# Patient Record
Sex: Male | Born: 2000 | Race: Black or African American | Hispanic: No | Marital: Single
Health system: Southern US, Community
[De-identification: ages and names within clinical notes are randomized; demographics above are authoritative.]

---
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
## 2020-12-01 ENCOUNTER — Emergency Department
Admission: EM | Admit: 2020-12-01 | Discharge: 2020-12-01 | Disposition: A | Discharge status: Home / Self Care | Payer: Medicaid Other | Attending: Emergency Medicine | Admitting: Emergency Medicine

## 2020-12-01 ENCOUNTER — Other Ambulatory Visit: Payer: Self-pay

## 2020-12-01 ENCOUNTER — Emergency Department: Payer: Medicaid Other

## 2020-12-01 DIAGNOSIS — R509 Fever, unspecified: Secondary | ICD-10-CM | POA: Diagnosis not present

## 2020-12-01 DIAGNOSIS — R5383 Other fatigue: Secondary | ICD-10-CM | POA: Diagnosis not present

## 2020-12-01 DIAGNOSIS — R109 Unspecified abdominal pain: Secondary | ICD-10-CM | POA: Diagnosis not present

## 2020-12-01 DIAGNOSIS — M545 Low back pain, unspecified: Secondary | ICD-10-CM | POA: Insufficient documentation

## 2020-12-01 DIAGNOSIS — R110 Nausea: Secondary | ICD-10-CM | POA: Diagnosis not present

## 2020-12-01 LAB — COMPREHENSIVE METABOLIC PANEL
ALT: 17 U/L (ref 0–44)
AST: 27 U/L (ref 15–41)
Albumin: 4.3 g/dL (ref 3.5–5.0)
Alkaline Phosphatase: 57 U/L (ref 38–126)
Anion gap: 8 (ref 5–15)
BUN: 14 mg/dL (ref 6–20)
CO2: 25 mmol/L (ref 22–32)
Calcium: 9.1 mg/dL (ref 8.9–10.3)
Chloride: 104 mmol/L (ref 98–111)
Creatinine, Ser: 1 mg/dL (ref 0.61–1.24)
GFR, Estimated: >60 mL/min (ref >60)
Glucose, Bld: 105 mg/dL — ABNORMAL HIGH (ref 70–99)
Potassium: 4 mmol/L (ref 3.5–5.1)
Sodium: 137 mmol/L (ref 135–145)
Total Bilirubin: 1 mg/dL (ref 0.3–1.2)
Total Protein: 7.8 g/dL (ref 6.5–8.1)

## 2020-12-01 LAB — CBC
HCT: 39.1 % (ref 39.0–52.0)
Hemoglobin: 13.7 g/dL (ref 13.0–17.0)
MCH: 29.5 pg (ref 26.0–34.0)
MCHC: 35 g/dL (ref 30.0–36.0)
MCV: 84.1 fL (ref 80.0–100.0)
Platelets: 159 10*3/uL (ref 150–400)
RBC: 4.65 MIL/uL (ref 4.22–5.81)
RDW: 13.8 % (ref 11.5–15.5)
WBC: 3.7 10*3/uL — ABNORMAL LOW (ref 4.0–10.5)
nRBC: 0 % (ref 0.0–0.2)

## 2020-12-01 LAB — LIPASE, BLOOD: Lipase: 30 U/L (ref 11–51)

## 2020-12-01 MED ORDER — SODIUM CHLORIDE 0.9 % IV SOLN
1000.0000 mL | Freq: Once | INTRAVENOUS | Status: AC
  Administered 2020-12-01: 1000 mL via INTRAVENOUS

## 2020-12-01 MED ORDER — ONDANSETRON HCL 4 MG/2ML IJ SOLN
4.0000 mg | Freq: Once | INTRAMUSCULAR | Status: AC
  Administered 2020-12-01: 4 mg via INTRAVENOUS
  Filled 2020-12-01: qty 2

## 2020-12-01 MED ORDER — ONDANSETRON 4 MG PO TBDP: 4.0000 mg | ORAL_TABLET | Freq: Three times a day (TID) | ORAL | 0 refills | Status: DC | PRN

## 2020-12-01 MED ORDER — KETOROLAC TROMETHAMINE 30 MG/ML IJ SOLN
30.0000 mg | Freq: Once | INTRAMUSCULAR | Status: AC
  Administered 2020-12-01: 30 mg via INTRAVENOUS
  Filled 2020-12-01: qty 1

## 2020-12-01 NOTE — ED Triage Notes (Signed)
Pt to ED via POV with c/o emesis that started today. Pt c/o HA, abdominal soreness, and back pain that radiates to his legs. Pt A&O x4, ambulatory without difficulty to triage desk.

## 2020-12-01 NOTE — ED Provider Notes (Signed)
[facility] Emergency Department Provider Note   ____________________________________________    I have reviewed the triage vital signs and the nursing notes.   HISTORY  Chief Complaint Abdominal Pain, Headache, and Nausea     HPI Ronald Dougherty is a 20 y.o. male who presents with complaints of abdominal pain, nausea and fatigue.  Patient reports sharp abdominal pain started yesterday, followed by nausea.  He now complains more of back pain, left greater than right.  Denies dysuria.  Positive chills, unclear if he has had a fever.  No sick contacts.  No nasal congestion.  No past medical history on file.  There are no problems to display for this patient.     Prior to Admission medications   Medication Sig Start Date End Date Taking? Authorizing Provider  ondansetron (ZOFRAN ODT) 4 MG disintegrating tablet Take 1 tablet (4 mg total) by mouth every 8 (eight) hours as needed. 12/01/20  Yes Jene Every, MD     Allergies Patient has no allergy information on record.  No family history on file.  Social History    Review of Systems  Constitutional: As above Eyes: No visual changes.  ENT: No nasal congestion Cardiovascular: Denies chest pain. Respiratory: Denies shortness of breath.  No cough gastrointestinal: As above Genitourinary: Negative for dysuria.  No hematuria Musculoskeletal: Negative for back pain. Skin: Negative for rash. Neurological: Positive mild global headache   ____________________________________________   PHYSICAL EXAM:  VITAL SIGNS: ED Triage Vitals [12/01/20 1144]  Enc Vitals Group     BP 134/83     Pulse Rate 65     Resp 18     Temp 98.9 F (37.2 C)     Temp Source Oral     SpO2 98 %     Weight 68 kg (150 lb)     Height 1.702 m (5\' 7" )     Head Circumference      Peak Flow      Pain Score 8     Pain Loc      Pain Edu?      Excl. in GC?     Constitutional: Alert and oriented.   Nose: No  congestion/rhinnorhea. Mouth/Throat: Mucous membranes are moist.    Cardiovascular: Normal rate, regular rhythm.  Good peripheral circulation. Respiratory: Normal respiratory effort.  No retractions. Lungs CTAB. Gastrointestinal: Soft and nontender. No distention.  No CVA tenderness.  Musculoskeletal: No lower extremity tenderness nor edema.  Warm and well perfused.  No vertebral tenderness palpation, no rash or bruising to the back Neurologic:  Normal speech and language. No gross focal neurologic deficits are appreciated.  Skin:  Skin is warm, dry and intact. No rash noted. Psychiatric: Mood and affect are normal. Speech and behavior are normal.  ____________________________________________   LABS (all labs ordered are listed, but only abnormal results are displayed)  Labs Reviewed  COMPREHENSIVE METABOLIC PANEL - Abnormal; Notable for the following components:      Result Value   Glucose, Bld 105 (*)    All other components within normal limits  CBC - Abnormal; Notable for the following components:   WBC 3.7 (*)    All other components within normal limits  LIPASE, BLOOD  URINALYSIS, COMPLETE (UACMP) WITH MICROSCOPIC   ____________________________________________  EKG None ____________________________________________  RADIOLOGY  CT renal stone study reviewed by me ____________________________________________   PROCEDURES  Procedure(s) performed: No  Procedures   Critical Care performed: No ____________________________________________   INITIAL IMPRESSION /  ASSESSMENT AND PLAN / ED COURSE  Pertinent labs & imaging results that were available during my care of the patient were reviewed by me and considered in my medical decision making (see chart for details).  Patient presents with symptoms as above, suspicious for viral GI illness/influenza.  Differential also includes ureterolithiasis given back pain and abdominal pain with nausea.  No dysuria to suggest  UTI.  Pending labs will treat with IV Toradol IV fluid bolus and IV Zofran  Lab work is overall reassuring,   CT renal stone study without acute abnormality  Patient feeling better after treatment, appropriate for discharge at this time   ____________________________________________   FINAL CLINICAL IMPRESSION(S) / ED DIAGNOSES  Final diagnoses:  Abdominal pain, unspecified abdominal location        Note:  This document was prepared using Dragon voice recognition software and may include unintentional dictation errors.   Jene Every, MD 12/01/20 [phone]

## 2020-12-01 NOTE — ED Notes (Signed)
Says he feels fine.  Looks relaxed and comfortabl now.
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
## 2021-05-27 ENCOUNTER — Inpatient Hospital Stay (HOSPITAL_COMMUNITY)
Admission: EM | Admit: 2021-05-27 | Discharge: 2021-05-30 | DRG: 200 | Disposition: A | Discharge status: Home / Self Care | Payer: Medicaid Other | Attending: Surgery | Admitting: Surgery

## 2021-05-27 ENCOUNTER — Emergency Department (HOSPITAL_COMMUNITY): Payer: Medicaid Other

## 2021-05-27 ENCOUNTER — Observation Stay (HOSPITAL_COMMUNITY): Payer: Medicaid Other

## 2021-05-27 DIAGNOSIS — R7401 Elevation of levels of liver transaminase levels: Secondary | ICD-10-CM | POA: Diagnosis present

## 2021-05-27 DIAGNOSIS — S2241XA Multiple fractures of ribs, right side, initial encounter for closed fracture: Secondary | ICD-10-CM | POA: Diagnosis present

## 2021-05-27 DIAGNOSIS — S272XXA Traumatic hemopneumothorax, initial encounter: Secondary | ICD-10-CM | POA: Diagnosis present

## 2021-05-27 DIAGNOSIS — S27321A Contusion of lung, unilateral, initial encounter: Secondary | ICD-10-CM | POA: Diagnosis present

## 2021-05-27 DIAGNOSIS — R17 Unspecified jaundice: Secondary | ICD-10-CM | POA: Diagnosis present

## 2021-05-27 DIAGNOSIS — F17210 Nicotine dependence, cigarettes, uncomplicated: Secondary | ICD-10-CM | POA: Diagnosis present

## 2021-05-27 DIAGNOSIS — R402411 Glasgow coma scale score 13-15, in the field [EMT or ambulance]: Secondary | ICD-10-CM | POA: Diagnosis present

## 2021-05-27 DIAGNOSIS — Z20822 Contact with and (suspected) exposure to covid-19: Secondary | ICD-10-CM | POA: Diagnosis present

## 2021-05-27 DIAGNOSIS — R079 Chest pain, unspecified: Secondary | ICD-10-CM | POA: Diagnosis present

## 2021-05-27 DIAGNOSIS — Y92410 Unspecified street and highway as the place of occurrence of the external cause: Secondary | ICD-10-CM

## 2021-05-27 DIAGNOSIS — J939 Pneumothorax, unspecified: Secondary | ICD-10-CM

## 2021-05-27 DIAGNOSIS — S060X9A Concussion with loss of consciousness of unspecified duration, initial encounter: Secondary | ICD-10-CM

## 2021-05-27 DIAGNOSIS — M542 Cervicalgia: Secondary | ICD-10-CM | POA: Diagnosis present

## 2021-05-27 DIAGNOSIS — Z23 Encounter for immunization: Secondary | ICD-10-CM | POA: Diagnosis not present

## 2021-05-27 DIAGNOSIS — S060XAA Concussion with loss of consciousness status unknown, initial encounter: Secondary | ICD-10-CM | POA: Diagnosis present

## 2021-05-27 DIAGNOSIS — T1490XA Injury, unspecified, initial encounter: Secondary | ICD-10-CM

## 2021-05-27 DIAGNOSIS — Z4682 Encounter for fitting and adjustment of non-vascular catheter: Secondary | ICD-10-CM

## 2021-05-27 DIAGNOSIS — S270XXA Traumatic pneumothorax, initial encounter: Secondary | ICD-10-CM

## 2021-05-27 DIAGNOSIS — J942 Hemothorax: Secondary | ICD-10-CM

## 2021-05-27 DIAGNOSIS — S00211A Abrasion of right eyelid and periocular area, initial encounter: Secondary | ICD-10-CM | POA: Diagnosis present

## 2021-05-27 LAB — RESP PANEL BY RT-PCR (FLU A&B, COVID) ARPGX2
Influenza A by PCR: NEGATIVE
Influenza B by PCR: NEGATIVE
SARS Coronavirus 2 by RT PCR: NEGATIVE

## 2021-05-27 LAB — HIV ANTIBODY (ROUTINE TESTING W REFLEX): HIV Screen 4th Generation wRfx: NONREACTIVE

## 2021-05-27 LAB — COMPREHENSIVE METABOLIC PANEL
ALT: 28 U/L (ref 0–44)
AST: 30 U/L (ref 15–41)
Albumin: 3.6 g/dL (ref 3.5–5.0)
Alkaline Phosphatase: 57 U/L (ref 38–126)
Anion gap: 8 (ref 5–15)
BUN: 17 mg/dL (ref 6–20)
CO2: 24 mmol/L (ref 22–32)
Calcium: 8.7 mg/dL — ABNORMAL LOW (ref 8.9–10.3)
Chloride: 106 mmol/L (ref 98–111)
Creatinine, Ser: 1.02 mg/dL (ref 0.61–1.24)
GFR, Estimated: >60 mL/min (ref >60)
Glucose, Bld: 159 mg/dL — ABNORMAL HIGH (ref 70–99)
Potassium: 3.6 mmol/L (ref 3.5–5.1)
Sodium: 138 mmol/L (ref 135–145)
Total Bilirubin: 0.6 mg/dL (ref 0.3–1.2)
Total Protein: 6.5 g/dL (ref 6.5–8.1)

## 2021-05-27 LAB — CBC
HCT: 42.3 % (ref 39.0–52.0)
HCT: 43.1 % (ref 39.0–52.0)
Hemoglobin: 13.7 g/dL (ref 13.0–17.0)
Hemoglobin: 14.1 g/dL (ref 13.0–17.0)
MCH: 28.8 pg (ref 26.0–34.0)
MCH: 29.1 pg (ref 26.0–34.0)
MCHC: 32.4 g/dL (ref 30.0–36.0)
MCHC: 32.7 g/dL (ref 30.0–36.0)
MCV: 88.9 fL (ref 80.0–100.0)
MCV: 88.9 fL (ref 80.0–100.0)
Platelets: 202 10*3/uL (ref 150–400)
Platelets: 157 10*3/uL (ref 150–400)
RBC: 4.76 MIL/uL (ref 4.22–5.81)
RBC: 4.85 MIL/uL (ref 4.22–5.81)
RDW: 14.6 % (ref 11.5–15.5)
RDW: 14.7 % (ref 11.5–15.5)
WBC: 6.8 10*3/uL (ref 4.0–10.5)
WBC: 14.2 10*3/uL — ABNORMAL HIGH (ref 4.0–10.5)
nRBC: 0 % (ref 0.0–0.2)
nRBC: 0 % (ref 0.0–0.2)

## 2021-05-27 LAB — I-STAT CHEM 8, ED
BUN: 21 mg/dL — ABNORMAL HIGH (ref 6–20)
Calcium, Ion: 1.1 mmol/L — ABNORMAL LOW (ref 1.15–1.40)
Chloride: 104 mmol/L (ref 98–111)
Creatinine, Ser: 0.9 mg/dL (ref 0.61–1.24)
Glucose, Bld: 155 mg/dL — ABNORMAL HIGH (ref 70–99)
HCT: 44 % (ref 39.0–52.0)
Hemoglobin: 15 g/dL (ref 13.0–17.0)
Potassium: 4 mmol/L (ref 3.5–5.1)
Sodium: 141 mmol/L (ref 135–145)
TCO2: 27 mmol/L (ref 22–32)

## 2021-05-27 LAB — LACTIC ACID, PLASMA: Lactic Acid, Venous: 2.2 mmol/L (ref 0.5–1.9)

## 2021-05-27 LAB — ETHANOL: Alcohol, Ethyl (B): <10 mg/dL (ref <10)

## 2021-05-27 LAB — CREATININE, SERUM
Creatinine, Ser: 0.84 mg/dL (ref 0.61–1.24)
GFR, Estimated: >60 mL/min (ref >60)

## 2021-05-27 LAB — PROTIME-INR
INR: 1 (ref 0.8–1.2)
Prothrombin Time: 13 seconds (ref 11.4–15.2)

## 2021-05-27 LAB — SAMPLE TO BLOOD BANK

## 2021-05-27 MED ORDER — ACETAMINOPHEN 325 MG PO TABS
650.0000 mg | ORAL_TABLET | Freq: Four times a day (QID) | ORAL | Status: DC
  Administered 2021-05-27 – 2021-05-30 (×12): 650 mg via ORAL
  Filled 2021-05-27 (×12): qty 2

## 2021-05-27 MED ORDER — FENTANYL CITRATE PF 50 MCG/ML IJ SOSY
PREFILLED_SYRINGE | INTRAMUSCULAR | Status: AC
  Administered 2021-05-27: 50 ug
  Filled 2021-05-27: qty 1

## 2021-05-27 MED ORDER — SODIUM CHLORIDE 0.9 % IV BOLUS
1000.0000 mL | Freq: Once | INTRAVENOUS | Status: AC
  Administered 2021-05-27: 1000 mL via INTRAVENOUS

## 2021-05-27 MED ORDER — ONDANSETRON HCL 4 MG/2ML IJ SOLN
4.0000 mg | Freq: Four times a day (QID) | INTRAMUSCULAR | Status: DC | PRN
  Administered 2021-05-27: 4 mg via INTRAVENOUS
  Filled 2021-05-27: qty 2

## 2021-05-27 MED ORDER — MIDAZOLAM HCL 2 MG/2ML IJ SOLN
INTRAMUSCULAR | Status: AC
  Administered 2021-05-27: 2 mg
  Filled 2021-05-27: qty 2

## 2021-05-27 MED ORDER — IOHEXOL 300 MG/ML  SOLN
100.0000 mL | Freq: Once | INTRAMUSCULAR | Status: AC | PRN
  Administered 2021-05-27: 100 mL via INTRAVENOUS

## 2021-05-27 MED ORDER — MORPHINE SULFATE (PF) 4 MG/ML IV SOLN
4.0000 mg | INTRAVENOUS | Status: DC | PRN
  Administered 2021-05-27 – 2021-05-28 (×5): 4 mg via INTRAVENOUS
  Filled 2021-05-27 (×5): qty 1

## 2021-05-27 MED ORDER — DOCUSATE SODIUM 100 MG PO CAPS
100.0000 mg | ORAL_CAPSULE | Freq: Two times a day (BID) | ORAL | Status: DC
  Administered 2021-05-28 – 2021-05-30 (×5): 100 mg via ORAL
  Filled 2021-05-27 (×5): qty 1

## 2021-05-27 MED ORDER — MIDAZOLAM HCL 2 MG/2ML IJ SOLN: 2.0000 mg | Freq: Once | INTRAMUSCULAR | Status: DC

## 2021-05-27 MED ORDER — METHOCARBAMOL 500 MG PO TABS
500.0000 mg | ORAL_TABLET | Freq: Three times a day (TID) | ORAL | Status: DC
  Administered 2021-05-27 (×2): 500 mg via ORAL
  Filled 2021-05-27 (×2): qty 1

## 2021-05-27 MED ORDER — ONDANSETRON 4 MG PO TBDP: 4.0000 mg | ORAL_TABLET | Freq: Four times a day (QID) | ORAL | Status: DC | PRN

## 2021-05-27 MED ORDER — METOPROLOL TARTRATE 5 MG/5ML IV SOLN: 5.0000 mg | Freq: Four times a day (QID) | INTRAVENOUS | Status: DC | PRN

## 2021-05-27 MED ORDER — FENTANYL CITRATE PF 50 MCG/ML IJ SOSY: 50.0000 ug | PREFILLED_SYRINGE | Freq: Once | INTRAMUSCULAR | Status: DC

## 2021-05-27 MED ORDER — OXYCODONE HCL 5 MG PO TABS
5.0000 mg | ORAL_TABLET | ORAL | Status: DC | PRN
  Administered 2021-05-27: 5 mg via ORAL
  Administered 2021-05-28 (×3): 10 mg via ORAL
  Administered 2021-05-28: 5 mg via ORAL
  Administered 2021-05-29 – 2021-05-30 (×5): 10 mg via ORAL
  Filled 2021-05-27 (×5): qty 2
  Filled 2021-05-27: qty 1
  Filled 2021-05-27: qty 2
  Filled 2021-05-27: qty 1
  Filled 2021-05-27 (×2): qty 2

## 2021-05-27 MED ORDER — ENOXAPARIN SODIUM 30 MG/0.3ML IJ SOSY
30.0000 mg | PREFILLED_SYRINGE | Freq: Two times a day (BID) | INTRAMUSCULAR | Status: DC
  Administered 2021-05-28 – 2021-05-30 (×5): 30 mg via SUBCUTANEOUS
  Filled 2021-05-27 (×5): qty 0.3

## 2021-05-27 MED ORDER — LIDOCAINE 5 % EX PTCH
1.0000 | MEDICATED_PATCH | CUTANEOUS | Status: DC
  Administered 2021-05-27 – 2021-05-30 (×4): 1 via TRANSDERMAL
  Filled 2021-05-27 (×4): qty 1

## 2021-05-27 MED ORDER — KCL IN DEXTROSE-NACL 20-5-0.45 MEQ/L-%-% IV SOLN
INTRAVENOUS | Status: DC
  Filled 2021-05-27 (×2): qty 1000

## 2021-05-27 MED ORDER — FENTANYL CITRATE PF 50 MCG/ML IJ SOSY
50.0000 ug | PREFILLED_SYRINGE | Freq: Once | INTRAMUSCULAR | Status: AC
  Administered 2021-05-27: 50 ug via INTRAVENOUS
  Filled 2021-05-27: qty 1

## 2021-05-27 MED ORDER — TETANUS-DIPHTH-ACELL PERTUSSIS 5-2.5-18.5 LF-MCG/0.5 IM SUSY
0.5000 mL | PREFILLED_SYRINGE | Freq: Once | INTRAMUSCULAR | Status: AC
  Administered 2021-05-27: 0.5 mL via INTRAMUSCULAR
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
## 2021-05-27 NOTE — ED Notes (Signed)
The pt is  sleeping with his 02 cannula on top of his nose   sats 97% ra
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
## 2021-05-27 NOTE — ED Notes (Signed)
The pt is   resting qietly

## 2021-05-27 NOTE — ED Notes (Signed)
Pt not wearing his 02  removed it
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
## 2021-05-27 NOTE — ED Notes (Signed)
Patient transported to CT by TRN ?

## 2021-05-27 NOTE — Procedures (Signed)
Insertion of Chest Tube Procedure Note  Ronald Dougherty  [id number]  [birth year]  Date:05/27/21  Time:12:31 PM    Provider Performing: Juliet Rude   Procedure: Chest Tube Insertion [phone])  Indication(s) Hemothorax  Consent Risks of the procedure as well as the alternatives and risks of each were explained to the patient and/or caregiver.  Consent for the procedure was obtained and is signed in the bedside chart  Anesthesia Topical with 1% lidocaine, 2 mg versed and 50 mcg fentanyl   Time Out Verified patient identification, verified procedure, site/side was marked, verified correct patient position, special equipment/implants available, medications/allergies/relevant history reviewed, required imaging and test results available.   Sterile Technique Maximal sterile technique including full sterile barrier drape, hand hygiene, sterile gown, sterile gloves, mask, hair covering, sterile ultrasound probe cover (if used).   Procedure Description Ultrasound not used to identify appropriate pleural anatomy for placement and overlying skin marked. Area of placement cleaned and draped in sterile fashion.  A 14 French pigtail pleural catheter was placed into the right pleural space using Seldinger technique. Appropriate return of air and blood was obtained.  The tube was connected to atrium and placed on -20 cm H2O wall suction.   Complications/Tolerance None; patient tolerated the procedure well. Chest X-ray is ordered to verify placement.   EBL Minimal  Specimen(s) none  Juliet Rude, [facility] Surgery 05/27/2021, 12:33 PM Please see Amion for pager number during day hours 7:00am-4:30pm

## 2021-05-27 NOTE — Progress Notes (Signed)
PT Cancellation Note  Patient Details Name: Ronald Dougherty MRN: [id number] DOB: [birth year]   Cancelled Treatment:    Reason Eval/Treat Not Completed: Medical issues which prohibited therapy Pt currently getting chest tube placed. Will hold and follow up as schedule allows.   Cindee Salt, DPT  Acute Rehabilitation Services  Pager: [phone] Office: [phone]  Lehman Prom 05/27/2021, 12:19 PM
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
## 2021-05-27 NOTE — Progress Notes (Signed)
OT Cancellation Note  Patient Details Name: CYPRESS FANFAN MRN: [id number] DOB: August 07, 2000   Cancelled Treatment:    Reason Eval/Treat Not Completed: Patient at procedure or test/ unavailable (currently having chest tube placed. will follow up later time.)  [facility] 05/27/2021, 2:31 PM Luisa Dago, OT/L   Acute OT Clinical Specialist Acute Rehabilitation Services Pager [phone] Office [phone]
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
## 2021-05-27 NOTE — ED Notes (Signed)
Report given to [place] rn

## 2021-05-27 NOTE — ED Notes (Signed)
The pt is asking for more pain medicine
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
## 2021-05-28 ENCOUNTER — Other Ambulatory Visit: Payer: Self-pay

## 2021-05-28 ENCOUNTER — Inpatient Hospital Stay (HOSPITAL_COMMUNITY): Payer: Medicaid Other

## 2021-05-28 ENCOUNTER — Encounter (HOSPITAL_COMMUNITY): Payer: Self-pay

## 2021-05-28 LAB — URINALYSIS, ROUTINE W REFLEX MICROSCOPIC
Bilirubin Urine: NEGATIVE
Glucose, UA: NEGATIVE mg/dL
Hgb urine dipstick: NEGATIVE
Ketones, ur: NEGATIVE mg/dL
Leukocytes,Ua: NEGATIVE
Nitrite: NEGATIVE
Protein, ur: NEGATIVE mg/dL
Specific Gravity, Urine: 1.01 (ref 1.005–1.030)
pH: 6.5 (ref 5.0–8.0)

## 2021-05-28 LAB — COMPREHENSIVE METABOLIC PANEL
ALT: 27 U/L (ref 0–44)
AST: 45 U/L — ABNORMAL HIGH (ref 15–41)
Albumin: 3.4 g/dL — ABNORMAL LOW (ref 3.5–5.0)
Alkaline Phosphatase: 52 U/L (ref 38–126)
Anion gap: 9 (ref 5–15)
BUN: 8 mg/dL (ref 6–20)
CO2: 23 mmol/L (ref 22–32)
Calcium: 8.8 mg/dL — ABNORMAL LOW (ref 8.9–10.3)
Chloride: 105 mmol/L (ref 98–111)
Creatinine, Ser: 0.77 mg/dL (ref 0.61–1.24)
GFR, Estimated: >60 mL/min (ref >60)
Glucose, Bld: 116 mg/dL — ABNORMAL HIGH (ref 70–99)
Potassium: 3.5 mmol/L (ref 3.5–5.1)
Sodium: 137 mmol/L (ref 135–145)
Total Bilirubin: 1.3 mg/dL — ABNORMAL HIGH (ref 0.3–1.2)
Total Protein: 6.4 g/dL — ABNORMAL LOW (ref 6.5–8.1)

## 2021-05-28 LAB — CBC
HCT: 39.4 % (ref 39.0–52.0)
Hemoglobin: 13 g/dL (ref 13.0–17.0)
MCH: 28.5 pg (ref 26.0–34.0)
MCHC: 33 g/dL (ref 30.0–36.0)
MCV: 86.4 fL (ref 80.0–100.0)
Platelets: 172 10*3/uL (ref 150–400)
RBC: 4.56 MIL/uL (ref 4.22–5.81)
RDW: 14.4 % (ref 11.5–15.5)
WBC: 10.9 10*3/uL — ABNORMAL HIGH (ref 4.0–10.5)
nRBC: 0 % (ref 0.0–0.2)

## 2021-05-28 MED ORDER — METHOCARBAMOL 500 MG PO TABS
1000.0000 mg | ORAL_TABLET | Freq: Three times a day (TID) | ORAL | Status: DC
  Administered 2021-05-28 (×3): 1000 mg via ORAL
  Filled 2021-05-28 (×3): qty 2

## 2021-05-28 MED ORDER — SODIUM CHLORIDE 0.9 % IV SOLN: 250.0000 mL | INTRAVENOUS | Status: DC | PRN

## 2021-05-28 MED ORDER — SODIUM CHLORIDE 0.9% FLUSH
3.0000 mL | Freq: Two times a day (BID) | INTRAVENOUS | Status: DC
  Administered 2021-05-28 – 2021-05-30 (×5): 3 mL via INTRAVENOUS

## 2021-05-28 MED ORDER — SODIUM CHLORIDE 0.9% FLUSH: 3.0000 mL | INTRAVENOUS | Status: DC | PRN

## 2021-05-28 MED ORDER — DIPHENHYDRAMINE HCL 25 MG PO CAPS
25.0000 mg | ORAL_CAPSULE | Freq: Three times a day (TID) | ORAL | Status: DC | PRN
  Administered 2021-05-28 (×2): 25 mg via ORAL
  Filled 2021-05-28 (×2): qty 1
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
## 2021-05-28 NOTE — Progress Notes (Signed)
OT Cancellation Note  Patient Details Name: Ronald Dougherty MRN: [id number] DOB: 2000/11/15   Cancelled Treatment:    Reason Eval/Treat Not Completed: OT screened, no needs identified, will sign off. Pt performing ADLs and functional mobility without difficulty during PT evaluation. Reports he feels comfortable with ADLs for transition to home. Will sign off.   Kosha Jaquith M Falisa Lamora Dezyre Hoefer MSOT, OTR/L Acute Rehab Pager: [phone] Office: [phone] 05/28/2021, 10:13 AM

## 2021-05-28 NOTE — Progress Notes (Signed)
Tele called to report that pt had 6 junctional beats then returned to sinus. Pt asymptomatic and resting comfortably in bed. Will continue to monitor.
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
## 2021-05-29 ENCOUNTER — Inpatient Hospital Stay (HOSPITAL_COMMUNITY): Payer: Medicaid Other

## 2021-05-29 LAB — COMPREHENSIVE METABOLIC PANEL
ALT: 26 U/L (ref 0–44)
AST: 49 U/L — ABNORMAL HIGH (ref 15–41)
Albumin: 3.5 g/dL (ref 3.5–5.0)
Alkaline Phosphatase: 49 U/L (ref 38–126)
Anion gap: 8 (ref 5–15)
BUN: 9 mg/dL (ref 6–20)
CO2: 26 mmol/L (ref 22–32)
Calcium: 9.1 mg/dL (ref 8.9–10.3)
Chloride: 103 mmol/L (ref 98–111)
Creatinine, Ser: 0.9 mg/dL (ref 0.61–1.24)
GFR, Estimated: >60 mL/min (ref >60)
Glucose, Bld: 89 mg/dL (ref 70–99)
Potassium: 3.8 mmol/L (ref 3.5–5.1)
Sodium: 137 mmol/L (ref 135–145)
Total Bilirubin: 1.4 mg/dL — ABNORMAL HIGH (ref 0.3–1.2)
Total Protein: 6.8 g/dL (ref 6.5–8.1)

## 2021-05-29 LAB — CBC
HCT: 39.6 % (ref 39.0–52.0)
Hemoglobin: 12.8 g/dL — ABNORMAL LOW (ref 13.0–17.0)
MCH: 28.3 pg (ref 26.0–34.0)
MCHC: 32.3 g/dL (ref 30.0–36.0)
MCV: 87.4 fL (ref 80.0–100.0)
Platelets: 183 10*3/uL (ref 150–400)
RBC: 4.53 MIL/uL (ref 4.22–5.81)
RDW: 14.7 % (ref 11.5–15.5)
WBC: 7.4 10*3/uL (ref 4.0–10.5)
nRBC: 0 % (ref 0.0–0.2)

## 2021-05-29 MED ORDER — POLYETHYLENE GLYCOL 3350 17 G PO PACK: 17.0000 g | PACK | Freq: Every day | ORAL | Status: DC | PRN

## 2021-05-29 MED ORDER — METHOCARBAMOL 500 MG PO TABS
1000.0000 mg | ORAL_TABLET | Freq: Three times a day (TID) | ORAL | Status: DC
  Administered 2021-05-29 – 2021-05-30 (×6): 1000 mg via ORAL
  Filled 2021-05-29 (×6): qty 2
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
## 2021-05-30 ENCOUNTER — Inpatient Hospital Stay (HOSPITAL_COMMUNITY): Payer: Medicaid Other

## 2021-05-30 LAB — HEPATIC FUNCTION PANEL
ALT: 24 U/L (ref 0–44)
AST: 39 U/L (ref 15–41)
Albumin: 3.5 g/dL (ref 3.5–5.0)
Alkaline Phosphatase: 55 U/L (ref 38–126)
Bilirubin, Direct: 0.2 mg/dL (ref 0.0–0.2)
Indirect Bilirubin: 1.3 mg/dL — ABNORMAL HIGH (ref 0.3–0.9)
Total Bilirubin: 1.5 mg/dL — ABNORMAL HIGH (ref 0.3–1.2)
Total Protein: 7.3 g/dL (ref 6.5–8.1)

## 2021-05-30 LAB — CBC
HCT: 40.3 % (ref 39.0–52.0)
Hemoglobin: 13.6 g/dL (ref 13.0–17.0)
MCH: 29.1 pg (ref 26.0–34.0)
MCHC: 33.7 g/dL (ref 30.0–36.0)
MCV: 86.3 fL (ref 80.0–100.0)
Platelets: 183 10*3/uL (ref 150–400)
RBC: 4.67 MIL/uL (ref 4.22–5.81)
RDW: 14.3 % (ref 11.5–15.5)
WBC: 6.9 10*3/uL (ref 4.0–10.5)
nRBC: 0 % (ref 0.0–0.2)

## 2021-05-30 LAB — BASIC METABOLIC PANEL
Anion gap: 9 (ref 5–15)
BUN: 15 mg/dL (ref 6–20)
CO2: 26 mmol/L (ref 22–32)
Calcium: 9.3 mg/dL (ref 8.9–10.3)
Chloride: 102 mmol/L (ref 98–111)
Creatinine, Ser: 0.93 mg/dL (ref 0.61–1.24)
GFR, Estimated: >60 mL/min (ref >60)
Glucose, Bld: 90 mg/dL (ref 70–99)
Potassium: 4 mmol/L (ref 3.5–5.1)
Sodium: 137 mmol/L (ref 135–145)

## 2021-05-30 MED ORDER — ACETAMINOPHEN 325 MG PO TABS: 650.0000 mg | ORAL_TABLET | Freq: Four times a day (QID) | ORAL | Status: AC | PRN

## 2021-05-30 MED ORDER — METHOCARBAMOL 1000 MG PO TABS: 1000.0000 mg | ORAL_TABLET | Freq: Three times a day (TID) | ORAL | 1 refills | Status: AC | PRN

## 2021-05-30 MED ORDER — DOCUSATE SODIUM 100 MG PO CAPS: 100.0000 mg | ORAL_CAPSULE | Freq: Two times a day (BID) | ORAL | 0 refills | Status: AC

## 2021-05-30 MED ORDER — POLYETHYLENE GLYCOL 3350 17 G PO PACK: 17.0000 g | PACK | Freq: Every day | ORAL | 0 refills | Status: AC | PRN

## 2021-05-30 MED ORDER — OXYCODONE HCL 5 MG PO TABS: 10.0000 mg | ORAL_TABLET | Freq: Four times a day (QID) | ORAL | 0 refills | Status: AC | PRN
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
## 2021-05-30 NOTE — Progress Notes (Signed)
Mobility Specialist Progress Note   05/30/21 1626  Mobility  Activity Ambulated in hall  Level of Assistance Independent  Assistive Device None  Distance Ambulated (ft) 550 ft  Mobility Ambulated independently in hallway  Mobility Response Tolerated well  Mobility performed by Mobility specialist  $Mobility charge 1 Mobility   Received pt in bed having no complaints and agreeable to mobility. Asymptomatic throughout ambulation, returned back to bed w/ call bell by side and RN in room.  Frederico Hamman Mobility Specialist Phone Number [phone]

## 2021-05-30 NOTE — Progress Notes (Signed)
Discharge instructions given to patient, patient verbalizes understanding. Iv removed, leads removed. Pt mom arrived, reviewed information with her. Patient discharge

## 2021-05-30 NOTE — Plan of Care (Signed)
  Problem: Education: Goal: Knowledge of General Education information will improve Description: Including pain rating scale, medication(s)/side effects and non-pharmacologic comfort measures Outcome: Progressing   Problem: Health Behavior/Discharge Planning: Goal: Ability to manage health-related needs will improve Outcome: Progressing   Problem: Clinical Measurements: Goal: Ability to maintain clinical measurements within normal limits will improve Outcome: Progressing Goal: Will remain free from infection Outcome: Progressing Goal: Diagnostic test results will improve Outcome: Progressing Goal: Respiratory complications will improve Outcome: Progressing Goal: Cardiovascular complication will be avoided Outcome: Progressing   Problem: Nutrition: Goal: Adequate nutrition will be maintained Outcome: Progressing   Problem: Activity: Goal: Risk for activity intolerance will decrease Outcome: Progressing   Problem: Coping: Goal: Level of anxiety will decrease Outcome: Progressing   Problem: Elimination: Goal: Will not experience complications related to bowel motility Outcome: Progressing Goal: Will not experience complications related to urinary retention Outcome: Progressing   Problem: Pain Managment: Goal: General experience of comfort will improve Outcome: Progressing   Problem: Safety: Goal: Ability to remain free from injury will improve Outcome: Progressing   

## 2023-02-03 IMAGING — DX DG CHEST 1V PORT
1 series · 1 of 1 positions shown · non-contrast
Comparison: Radiograph 05/30/2021

CLINICAL DATA: Chest tube removal

EXAM:
PORTABLE CHEST 1 VIEW

[chest]
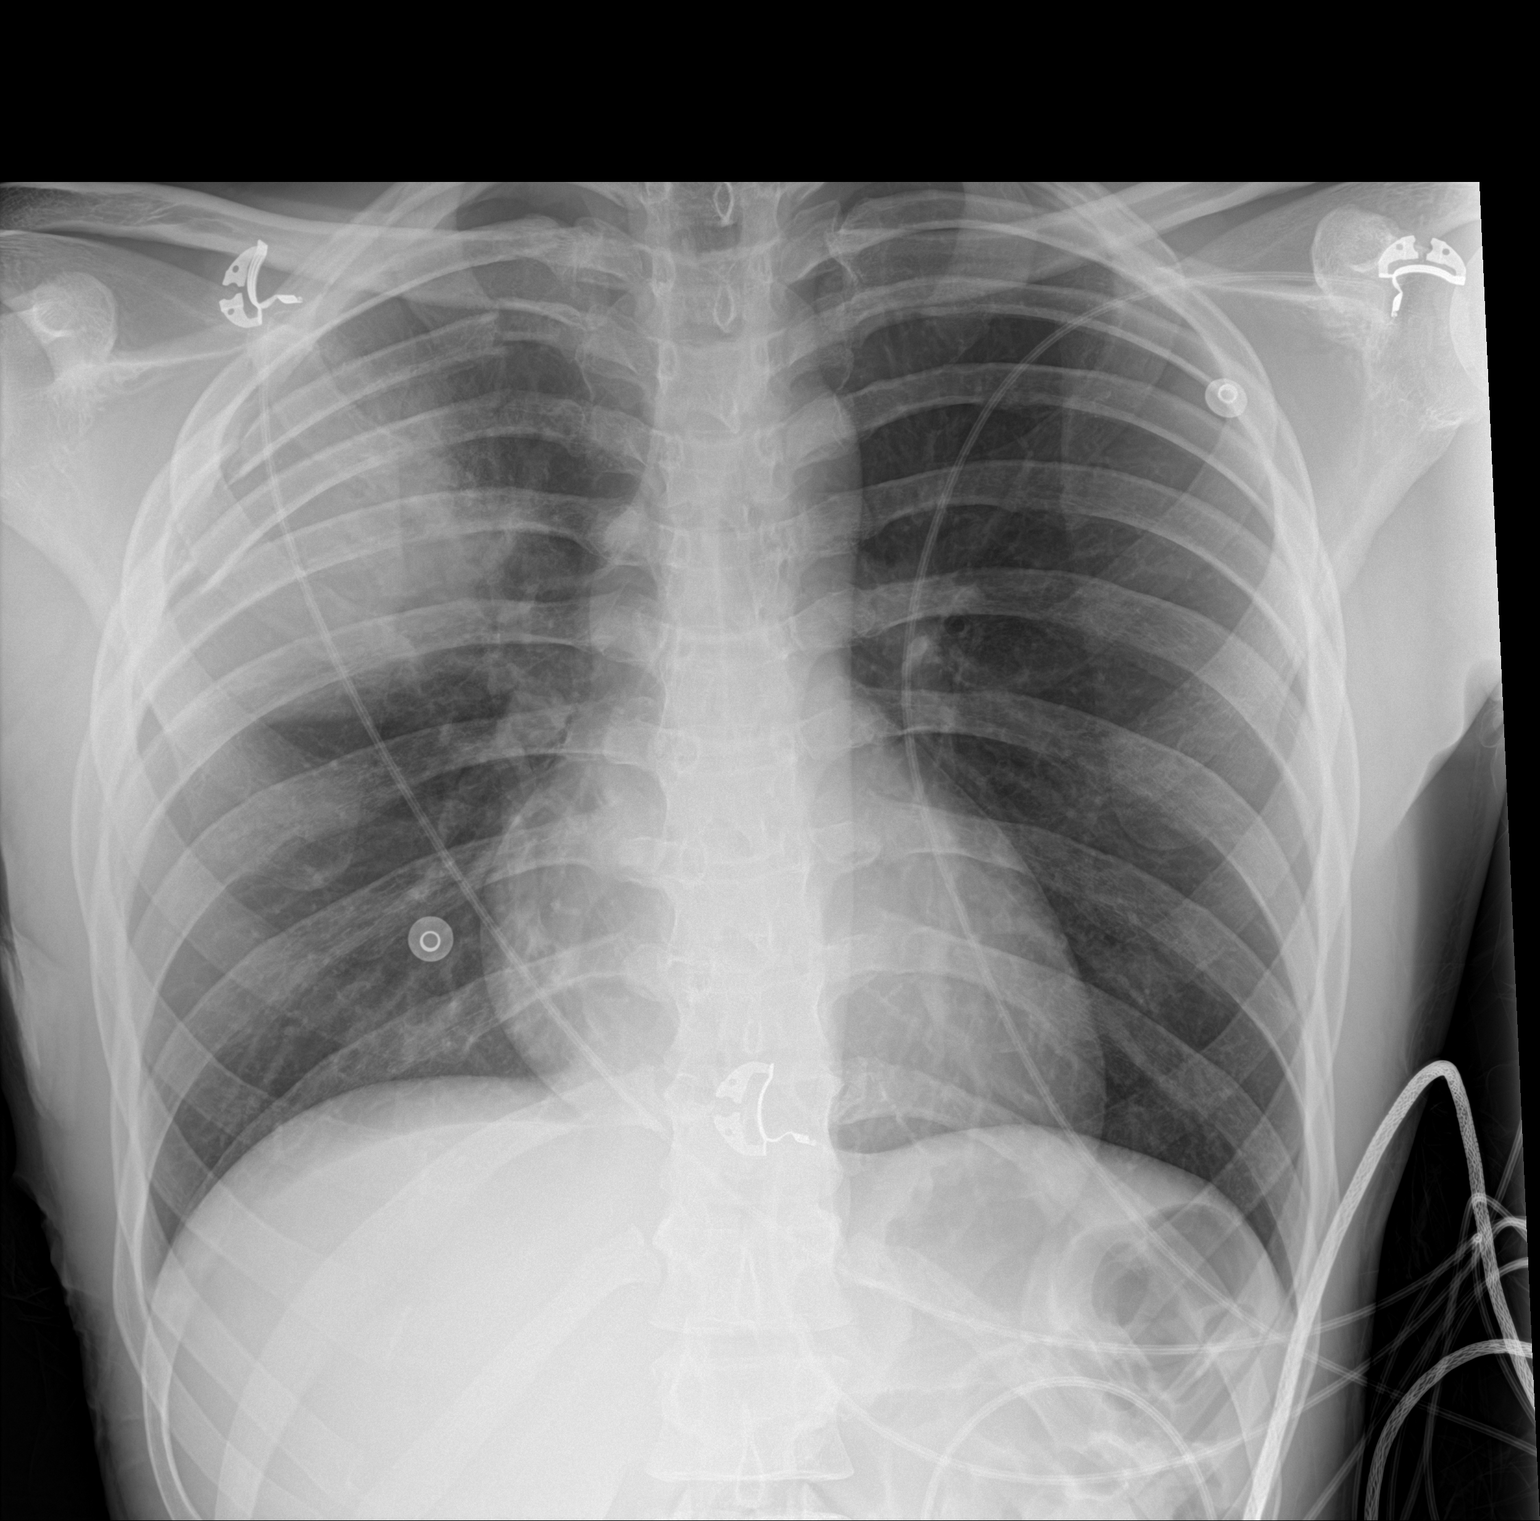

[1 of 1 positions shown; findings below may reference images not displayed]

FINDINGS: Normal cardiac silhouette. Interval removal of RIGHT chest tube. No
pneumothorax.

Posterior RIGHT upper lobe rib fractures again noted. RIGHT upper
lobe contusion noted.
IMPRESSION: 1. Removal of RIGHT chest tube without pneumothorax.
2. RIGHT upper rib fractures and pulmonary contusion.
